# Patient Record
Sex: Female | Born: 1960 | Race: White | Hispanic: No | Marital: Married | Smoking: Never smoker
Health system: Southern US, Community
[De-identification: ages and names within clinical notes are randomized; demographics above are authoritative.]

## PROBLEM LIST (undated history)

## (undated) DIAGNOSIS — Z8742 Personal history of other diseases of the female genital tract: Secondary | ICD-10-CM

## (undated) DIAGNOSIS — N200 Calculus of kidney: Secondary | ICD-10-CM

## (undated) DIAGNOSIS — Z862 Personal history of diseases of the blood and blood-forming organs and certain disorders involving the immune mechanism: Secondary | ICD-10-CM

## (undated) HISTORY — DX: Calculus of kidney: N20.0

## (undated) HISTORY — DX: Personal history of other diseases of the female genital tract: Z87.42

## (undated) HISTORY — DX: Personal history of diseases of the blood and blood-forming organs and certain disorders involving the immune mechanism: Z86.2

## (undated) HISTORY — PX: OTHER SURGICAL HISTORY: SHX169

---

## 2014-09-24 LAB — HM PAP SMEAR: HM PAP: NEGATIVE

## 2016-08-02 ENCOUNTER — Encounter: Payer: Self-pay | Admitting: Certified Nurse Midwife

## 2016-08-02 ENCOUNTER — Ambulatory Visit (INDEPENDENT_AMBULATORY_CARE_PROVIDER_SITE_OTHER): Payer: BLUE CROSS/BLUE SHIELD | Admitting: Certified Nurse Midwife

## 2016-08-02 ENCOUNTER — Other Ambulatory Visit: Payer: Self-pay | Admitting: Certified Nurse Midwife

## 2016-08-02 VITALS — BP 140/96 | HR 66 | Ht 67.0 in | Wt 186.1 lb

## 2016-08-02 DIAGNOSIS — Z1231 Encounter for screening mammogram for malignant neoplasm of breast: Secondary | ICD-10-CM

## 2016-08-02 DIAGNOSIS — E049 Nontoxic goiter, unspecified: Secondary | ICD-10-CM

## 2016-08-02 DIAGNOSIS — Z1239 Encounter for other screening for malignant neoplasm of breast: Secondary | ICD-10-CM

## 2016-08-02 NOTE — Patient Instructions (Signed)
Preventive Care 40-64 Years, Female Preventive care refers to lifestyle choices and visits with your health care provider that can promote health and wellness. What does preventive care include?  A yearly physical exam. This is also called an annual well check.  Dental exams once or twice a year.  Routine eye exams. Ask your health care provider how often you should have your eyes checked.  Personal lifestyle choices, including: ? Daily care of your teeth and gums. ? Regular physical activity. ? Eating a healthy diet. ? Avoiding tobacco and drug use. ? Limiting alcohol use. ? Practicing safe sex. ? Taking low-dose aspirin daily starting at age 56. ? Taking vitamin and mineral supplements as recommended by your health care provider. What happens during an annual well check? The services and screenings done by your health care provider during your annual well check will depend on your age, overall health, lifestyle risk factors, and family history of disease. Counseling Your health care provider may ask you questions about your:  Alcohol use.  Tobacco use.  Drug use.  Emotional well-being.  Home and relationship well-being.  Sexual activity.  Eating habits.  Work and work Statistician.  Method of birth control.  Menstrual cycle.  Pregnancy history.  Screening You may have the following tests or measurements:  Height, weight, and BMI.  Blood pressure.  Lipid and cholesterol levels. These may be checked every 5 years, or more frequently if you are over 56 years old.  Skin check.  Lung cancer screening. You may have this screening every year starting at age 56 if you have a 30-pack-year history of smoking and currently smoke or have quit within the past 15 years.  Fecal occult blood test (FOBT) of the stool. You may have this test every year starting at age 56.  Flexible sigmoidoscopy or colonoscopy. You may have a sigmoidoscopy every 5 years or a colonoscopy  every 10 years starting at age 30.  Hepatitis C blood test.  Hepatitis B blood test.  Sexually transmitted disease (STD) testing.  Diabetes screening. This is done by checking your blood sugar (glucose) after you have not eaten for a while (fasting). You may have this done every 1-3 years.  Mammogram. This may be done every 1-2 years. Talk to your health care provider about when you should start having regular mammograms. This may depend on whether you have a family history of breast cancer.  BRCA-related cancer screening. This may be done if you have a family history of breast, ovarian, tubal, or peritoneal cancers.  Pelvic exam and Pap test. This may be done every 3 years starting at age 56. Starting at age 56, this may be done every 5 years if you have a Pap test in combination with an HPV test.  Bone density scan. This is done to screen for osteoporosis. You may have this scan if you are at high risk for osteoporosis.  Discuss your test results, treatment options, and if necessary, the need for more tests with your health care provider. Vaccines Your health care provider may recommend certain vaccines, such as:  Influenza vaccine. This is recommended every year.  Tetanus, diphtheria, and acellular pertussis (Tdap, Td) vaccine. You may need a Td booster every 10 years.  Varicella vaccine. You may need this if you have not been vaccinated.  Zoster vaccine. You may need this after age 56.  Measles, mumps, and rubella (MMR) vaccine. You may need at least one dose of MMR if you were born in  1956 or later. You may also need a second dose.  Pneumococcal 13-valent conjugate (PCV13) vaccine. You may need this if you have certain conditions and were not previously vaccinated.  Pneumococcal polysaccharide (PPSV23) vaccine. You may need one or two doses if you smoke cigarettes or if you have certain conditions.  Meningococcal vaccine. You may need this if you have certain  conditions.  Hepatitis A vaccine. You may need this if you have certain conditions or if you travel or work in places where you may be exposed to hepatitis A.  Hepatitis B vaccine. You may need this if you have certain conditions or if you travel or work in places where you may be exposed to hepatitis B.  Haemophilus influenzae type b (Hib) vaccine. You may need this if you have certain conditions.  Talk to your health care provider about which screenings and vaccines you need and how often you need them. This information is not intended to replace advice given to you by your health care provider. Make sure you discuss any questions you have with your health care provider. Document Released: 02/05/2015 Document Revised: 09/29/2015 Document Reviewed: 11/10/2014 Elsevier Interactive Patient Education  2017 [employer].

## 2016-08-02 NOTE — Progress Notes (Signed)
ANNUAL PREVENTATIVE CARE GYN  ENCOUNTER NOTE  Subjective:       Amy Ewing is a 56 y.o. No obstetric history on file. female here for a routine annual gynecologic exam.  Current complaints: 1.  None    Gynecologic History Patient's last menstrual period was 04/23/2016 (approximate). Contraception: condoms Last Pap: 2016. Results were: normal Last mammogram: 2016. Results were: normal  Obstetric History OB History  No data available    No past medical history on file.  No past surgical history on file.  No current outpatient prescriptions on file prior to visit.   No current facility-administered medications on file prior to visit.     Allergies no known allergies  Social History   Social History  . Marital status: Married    Spouse name: N/A  . Number of children: N/A  . Years of education: N/A   Occupational History  . Not on file.   Social History Main Topics  . Smoking status: Not on file  . Smokeless tobacco: Not on file  . Alcohol use Not on file  . Drug use: Unknown  . Sexual activity: Not on file   Other Topics Concern  . Not on file   Social History Narrative  . No narrative on file    No family history on file.  The following portions of the patient's history were reviewed and updated as appropriate: allergies, current medications, past family history, past medical history, past social history, past surgical history and problem list.  Review of Systems ROS Review of Systems - General ROS: negative for - chills, fatigue, fever, hot flashes, night sweats, weight gain or weight loss Psychological ROS: negative for - anxiety, decreased libido, depression, mood swings, physical abuse or sexual abuse Ophthalmic ROS: negative for - blurry vision, eye pain or loss of vision ENT ROS: negative for - headaches, hearing change, visual changes or vocal changes Allergy and Immunology ROS: negative for - hives, itchy/watery eyes or seasonal  allergies Hematological and Lymphatic ROS: negative for - bleeding problems, bruising, swollen lymph nodes or weight loss Endocrine ROS: negative for - galactorrhea, hair pattern changes, hot flashes, malaise/lethargy, mood swings, palpitations, polydipsia/polyuria, skin changes, temperature intolerance or unexpected weight changes Breast ROS: negative for - new or changing breast lumps or nipple discharge Respiratory ROS: negative for - cough or shortness of breath Cardiovascular ROS: negative for - chest pain, irregular heartbeat, palpitations or shortness of breath Gastrointestinal ROS: no abdominal pain, change in bowel habits, or black or bloody stools Genito-Urinary ROS: no dysuria, trouble voiding, or hematuria Musculoskeletal ROS: negative for - joint pain or joint stiffness Neurological ROS: negative for - bowel and bladder control changes Dermatological ROS: negative for rash and skin lesion changes   Objective:   BP (!) 140/96   Pulse 66   Ht 5\' 7"  (1.702 m)   Wt 186 lb 1.6 oz (84.4 kg)   LMP 04/23/2016 (Approximate)   BMI 29.15 kg/m  CONSTITUTIONAL: Well-developed, well-nourished female in no acute distress.  PSYCHIATRIC: Normal mood and affect. Normal behavior. Normal judgment and thought content. NEUROLGIC: Alert and oriented to person, place, and time. Normal muscle tone coordination. No cranial nerve deficit noted. HENT:  Normocephalic, atraumatic, External right and left ear normal. Oropharynx is clear and moist EYES: Conjunctivae and EOM are normal. Pupils are equal, round, and reactive to light. No scleral icterus.  NECK: Normal range of motion, supple, no masses.  Thyroid enlarged right side.   SKIN: Skin is warm and  dry. No rash noted. Not diaphoretic. No erythema. No pallor. CARDIOVASCULAR: Normal heart rate noted, regular rhythm, no murmur. RESPIRATORY: Clear to auscultation bilaterally. Effort and breath sounds normal, no problems with respiration noted. BREASTS:  Symmetric in size. No masses, skin changes, nipple drainage, or lymphadenopathy. ABDOMEN: Soft, normal bowel sounds, no distention noted.  No tenderness, rebound or guarding.  BLADDER: Normal PELVIC:  External Genitalia: Normal  BUS: Normal  Vagina: Normal  Cervix: Normal  Uterus: Normal  Adnexa: Normal  RV: External Exam NormaI  MUSCULOSKELETAL: Normal range of motion. No tenderness.  No cyanosis, clubbing, or edema.  2+ distal pulses. LYMPHATIC: No Axillary, Supraclavicular, or Inguinal Adenopathy.    Assessment:   Annual gynecologic examination 56 y.o. Contraception: condoms Overweight Problem List Items Addressed This Visit    None      Plan:  Pap: Pap, Reflex if ASCUS, Pap Co Test, GC/CT NAAT and Not done Mammogram: Ordered Stool Guaiac Testing:  Not Indicated Labs: TSH, will follow up with results Routine preventative health maintenance measures emphasized: Exercise/Diet/Weight control and Stress Management Pt has history of breast cancer in her family, genetic testing options discussed. Return to Clinic - 1 Year   Doreene Burke, [place]

## 2016-08-03 LAB — TSH: TSH: 3.75 u[IU]/mL (ref 0.450–4.500)

## 2016-08-24 ENCOUNTER — Ambulatory Visit
Admission: RE | Admit: 2016-08-24 | Discharge: 2016-08-24 | Disposition: A | Discharge status: Home / Self Care | Payer: BLUE CROSS/BLUE SHIELD | Source: Ambulatory Visit | Attending: Certified Nurse Midwife | Admitting: Certified Nurse Midwife

## 2016-08-24 DIAGNOSIS — Z1231 Encounter for screening mammogram for malignant neoplasm of breast: Secondary | ICD-10-CM | POA: Diagnosis present

## 2016-09-04 ENCOUNTER — Inpatient Hospital Stay
Admission: RE | Admit: 2016-09-04 | Discharge: 2016-09-04 | Disposition: A | Discharge status: Home / Self Care | Payer: Self-pay | Source: Ambulatory Visit | Attending: *Deleted | Admitting: *Deleted

## 2016-09-04 ENCOUNTER — Other Ambulatory Visit: Payer: Self-pay | Admitting: *Deleted

## 2016-09-04 DIAGNOSIS — Z9289 Personal history of other medical treatment: Secondary | ICD-10-CM

## 2017-06-27 ENCOUNTER — Ambulatory Visit: Payer: BLUE CROSS/BLUE SHIELD | Admitting: Internal Medicine

## 2017-08-03 ENCOUNTER — Encounter: Payer: BLUE CROSS/BLUE SHIELD | Admitting: Certified Nurse Midwife

## 2017-08-08 ENCOUNTER — Ambulatory Visit (INDEPENDENT_AMBULATORY_CARE_PROVIDER_SITE_OTHER): Payer: BLUE CROSS/BLUE SHIELD | Admitting: Certified Nurse Midwife

## 2017-08-08 VITALS — BP 131/81 | HR 75 | Ht 67.0 in | Wt 192.6 lb

## 2017-08-08 DIAGNOSIS — Z01419 Encounter for gynecological examination (general) (routine) without abnormal findings: Secondary | ICD-10-CM

## 2017-08-08 NOTE — Patient Instructions (Signed)
Preventive Care 40-64 Years, Female Preventive care refers to lifestyle choices and visits with your health care provider that can promote health and wellness. What does preventive care include?  A yearly physical exam. This is also called an annual well check.  Dental exams once or twice a year.  Routine eye exams. Ask your health care provider how often you should have your eyes checked.  Personal lifestyle choices, including: ? Daily care of your teeth and gums. ? Regular physical activity. ? Eating a healthy diet. ? Avoiding tobacco and drug use. ? Limiting alcohol use. ? Practicing safe sex. ? Taking low-dose aspirin daily starting at age 58. ? Taking vitamin and mineral supplements as recommended by your health care provider. What happens during an annual well check? The services and screenings done by your health care provider during your annual well check will depend on your age, overall health, lifestyle risk factors, and family history of disease. Counseling Your health care provider may ask you questions about your:  Alcohol use.  Tobacco use.  Drug use.  Emotional well-being.  Home and relationship well-being.  Sexual activity.  Eating habits.  Work and work Statistician.  Method of birth control.  Menstrual cycle.  Pregnancy history.  Screening You may have the following tests or measurements:  Height, weight, and BMI.  Blood pressure.  Lipid and cholesterol levels. These may be checked every 5 years, or more frequently if you are over 81 years old.  Skin check.  Lung cancer screening. You may have this screening every year starting at age 78 if you have a 30-pack-year history of smoking and currently smoke or have quit within the past 15 years.  Fecal occult blood test (FOBT) of the stool. You may have this test every year starting at age 65.  Flexible sigmoidoscopy or colonoscopy. You may have a sigmoidoscopy every 5 years or a colonoscopy  every 10 years starting at age 30.  Hepatitis C blood test.  Hepatitis B blood test.  Sexually transmitted disease (STD) testing.  Diabetes screening. This is done by checking your blood sugar (glucose) after you have not eaten for a while (fasting). You may have this done every 1-3 years.  Mammogram. This may be done every 1-2 years. Talk to your health care provider about when you should start having regular mammograms. This may depend on whether you have a family history of breast cancer.  BRCA-related cancer screening. This may be done if you have a family history of breast, ovarian, tubal, or peritoneal cancers.  Pelvic exam and Pap test. This may be done every 3 years starting at age 80. Starting at age 36, this may be done every 5 years if you have a Pap test in combination with an HPV test.  Bone density scan. This is done to screen for osteoporosis. You may have this scan if you are at high risk for osteoporosis.  Discuss your test results, treatment options, and if necessary, the need for more tests with your health care provider. Vaccines Your health care provider may recommend certain vaccines, such as:  Influenza vaccine. This is recommended every year.  Tetanus, diphtheria, and acellular pertussis (Tdap, Td) vaccine. You may need a Td booster every 10 years.  Varicella vaccine. You may need this if you have not been vaccinated.  Zoster vaccine. You may need this after age 5.  Measles, mumps, and rubella (MMR) vaccine. You may need at least one dose of MMR if you were born in  1957 or later. You may also need a second dose.  Pneumococcal 13-valent conjugate (PCV13) vaccine. You may need this if you have certain conditions and were not previously vaccinated.  Pneumococcal polysaccharide (PPSV23) vaccine. You may need one or two doses if you smoke cigarettes or if you have certain conditions.  Meningococcal vaccine. You may need this if you have certain  conditions.  Hepatitis A vaccine. You may need this if you have certain conditions or if you travel or work in places where you may be exposed to hepatitis A.  Hepatitis B vaccine. You may need this if you have certain conditions or if you travel or work in places where you may be exposed to hepatitis B.  Haemophilus influenzae type b (Hib) vaccine. You may need this if you have certain conditions.  Talk to your health care provider about which screenings and vaccines you need and how often you need them. This information is not intended to replace advice given to you by your health care provider. Make sure you discuss any questions you have with your health care provider. Document Released: 02/05/2015 Document Revised: 09/29/2015 Document Reviewed: 11/10/2014 Elsevier Interactive Patient Education  2018 [employer]  

## 2017-08-08 NOTE — Progress Notes (Signed)
GYNECOLOGY ANNUAL PREVENTATIVE CARE ENCOUNTER NOTE  Subjective:   Amy Ewing is a 57 y.o. [id number] female here for a routine annual gynecologic exam.  Current complaints: none  Denies abnormal vaginal bleeding, discharge, pelvic pain, problems with intercourse or other gynecologic concerns.    Gynecologic History Patient's last menstrual period was 01/22/2017. Contraception: none Last Pap: 2016 Results were: normal  Last mammogram: 08/24/2016. Results were: normal  Obstetric History OB History  Gravida Para Term Preterm AB Living  2 2 2     2   SAB TAB Ectopic Multiple Live Births          2    # Outcome Date GA Lbr Len/2nd Weight Sex Delivery Anes PTL Lv  2 Term 07/30/90 [redacted]w[redacted]d   F Vag-Spont   LIV  1 Term 03/06/89 [redacted]w[redacted]d   F Vag-Spont   LIV    Past Medical History:  Diagnosis Date  . H/O: iron deficiency anemia   . History of heavy periods     Past Surgical History:  Procedure Laterality Date  . none      No current outpatient medications on file prior to visit.   No current facility-administered medications on file prior to visit.     No Known Allergies  Social History:  reports that she has never smoked. She has never used smokeless tobacco. She reports that she drinks alcohol. She reports that she does not use drugs.  Family History  Problem Relation Age of Onset  . Cancer Mother 82       breast  . Breast cancer Mother 34  . Cancer Father        prostate  . Hypertension Sister   . Diabetes Maternal Grandmother   . Thyroid disease Maternal Grandmother    Exercise daily: walking 30-45 min Denies Drugs, smoking. Occasional alcohol.  Healthy diet: fruit/vegatables/lean meat. Fast food 1-2 month.  The following portions of the patient's history were reviewed and updated as appropriate: allergies, current medications, past family history, past medical history, past social history, past surgical history and problem list.  Review of Systems Pertinent items noted  in HPI and remainder of comprehensive ROS otherwise negative.   Objective:  BP 131/81   Pulse 75   Ht 5\' 7"  (1.702 m)   Wt 192 lb 9 oz (87.3 kg)   LMP 01/22/2017   BMI 30.16 kg/m  CONSTITUTIONAL: Well-developed, well-nourished female in no acute distress.  HENT:  Normocephalic, atraumatic, External right and left ear normal. Oropharynx is clear and moist EYES: Conjunctivae and EOM are normal. Pupils are equal, round, and reactive to light. No scleral icterus.  NECK: Normal range of motion, supple, no masses.  Normal thyroid.  SKIN: Skin is warm and dry. No rash noted. Not diaphoretic. No erythema. No pallor. MUSCULOSKELETAL: Normal range of motion. No tenderness.  No cyanosis, clubbing, or edema.  2+ distal pulses. NEUROLOGIC: Alert and oriented to person, place, and time. Normal reflexes, muscle tone coordination. No cranial nerve deficit noted. PSYCHIATRIC: Normal mood and affect. Normal behavior. Normal judgment and thought content. CARDIOVASCULAR: Normal heart rate noted, regular rhythm RESPIRATORY: Clear to auscultation bilaterally. Effort and breath sounds normal, no problems with respiration noted. BREASTS: Symmetric in size. No masses, skin changes, nipple drainage, or lymphadenopathy. ABDOMEN: Soft, normal bowel sounds, no distention noted.  No tenderness, rebound or guarding.  PELVIC: Normal appearing external genitalia; normal appearing vaginal mucosa and cervix.  No abnormal discharge noted.  Pap smear obtained.  Normal uterine size, no other  palpable masses, no uterine or adnexal tenderness.    Assessment and Plan:  1. Women's annual routine gynecological examination - Pap IG and HPV (high risk) DNA detection  Will follow up results of pap smear and manage accordingly. Mammogram scheduled Colonoscopy 4 yrs ago/had polyp removed then repeated colonoscopy. Recommended repeat in 2021( per pt). Lipid profile & fasting glucose done by PCP per pt. TSH done last year.   Reviewed Signs and symptoms of Menopause she has mild hot flashes that she is tolerating. Handout "menonotes" given to pt.  Routine preventative health maintenance measures emphasized. Please refer to After Visit Summary for other counseling recommendations.    Doreene BurkeAnnie Urho Rio, CNM

## 2017-08-08 NOTE — Progress Notes (Signed)
Pt is here for a pap smear. LPS 2016 WNL. Has mammogram scheduled.

## 2017-08-11 LAB — PAP IG AND HPV HIGH-RISK
HPV, HIGH-RISK: NEGATIVE
PAP SMEAR COMMENT: 0

## 2017-08-13 ENCOUNTER — Telehealth: Payer: Self-pay

## 2017-08-13 NOTE — Telephone Encounter (Signed)
Pt informed of neg pap and HPV results per AT.

## 2017-09-12 ENCOUNTER — Other Ambulatory Visit: Payer: Self-pay | Admitting: Certified Nurse Midwife

## 2017-09-12 DIAGNOSIS — Z1231 Encounter for screening mammogram for malignant neoplasm of breast: Secondary | ICD-10-CM

## 2017-09-26 ENCOUNTER — Ambulatory Visit
Admission: RE | Admit: 2017-09-26 | Discharge: 2017-09-26 | Disposition: A | Discharge status: Home / Self Care | Payer: BLUE CROSS/BLUE SHIELD | Source: Ambulatory Visit | Attending: Certified Nurse Midwife | Admitting: Certified Nurse Midwife

## 2017-09-26 DIAGNOSIS — Z1231 Encounter for screening mammogram for malignant neoplasm of breast: Secondary | ICD-10-CM | POA: Diagnosis present

## 2018-08-13 ENCOUNTER — Telehealth: Payer: Self-pay

## 2018-08-13 NOTE — Telephone Encounter (Signed)
Coronavirus (COVID-19) Are you at risk?  Are you at risk for the Coronavirus (COVID-19)?  To be considered HIGH RISK for Coronavirus (COVID-19), you have to meet the following criteria:  . Traveled to [place], [place], [place], [place] or [place]; or in the [place] to [place], [place], [place], or [place]; and have fever, cough, and shortness of breath within the last 2 weeks of travel OR . Been in close contact with a person diagnosed with COVID-19 within the last 2 weeks and have fever, cough, and shortness of breath . IF YOU DO NOT MEET THESE CRITERIA, YOU ARE CONSIDERED LOW RISK FOR COVID-19.  What to do if you are HIGH RISK for COVID-19?  . If you are having a medical emergency, call 911. . Seek medical care right away. Before you go to a doctor's office, urgent care or emergency department, call ahead and tell them about your recent travel, contact with someone diagnosed with COVID-19, and your symptoms. You should receive instructions from your physician's office regarding next steps of care.  . When you arrive at healthcare provider, tell the healthcare staff immediately you have returned from visiting [place], [place], [place], [place] or [place]; or traveled in the [place] to [place], [place], [place], or [place]; in the last two weeks or you have been in close contact with a person diagnosed with COVID-19 in the last 2 weeks.   . Tell the health care staff about your symptoms: fever, cough and shortness of breath. . After you have been seen by a medical provider, you will be either: o Tested for (COVID-19) and discharged home on quarantine except to seek medical care if symptoms worsen, and asked to  - Stay home and avoid contact with others until you get your results (4-5 days)  - Avoid travel on public transportation if possible (such as bus, train, or airplane) or o Sent to the Emergency Department by EMS for evaluation, COVID-19 testing, and possible  admission depending on your condition and test results.  What to do if you are LOW RISK for COVID-19?  Reduce your risk of any infection by using the same precautions used for avoiding the common cold or flu:  . Wash your hands often with soap and warm water for at least 20 seconds.  If soap and water are not readily available, use an alcohol-based hand sanitizer with at least 60% alcohol.  . If coughing or sneezing, cover your mouth and nose by coughing or sneezing into the elbow areas of your shirt or coat, into a tissue or into your sleeve (not your hands). . Avoid shaking hands with others and consider head nods or verbal greetings only. . Avoid touching your eyes, nose, or mouth with unwashed hands.  . Avoid close contact with people who are [place]. . Avoid places or events with large numbers of people in one location, like concerts or sporting events. . Carefully consider travel plans you have or are making. . If you are planning any travel outside or inside the [place], visit the CDC's Travelers' Health webpage for the latest health notices. . If you have some symptoms but not all symptoms, continue to monitor at home and seek medical attention if your symptoms worsen. . If you are having a medical emergency, call 911.  08/13/18 SCREENING NEG SLS ADDITIONAL HEALTHCARE OPTIONS FOR PATIENTS  [facility] / e-Visit: [email or web address]         [facility]: [phone]    [facility]: [phone]                   [facility]: [phone]  

## 2018-08-14 ENCOUNTER — Other Ambulatory Visit: Payer: Self-pay

## 2018-08-14 ENCOUNTER — Ambulatory Visit (INDEPENDENT_AMBULATORY_CARE_PROVIDER_SITE_OTHER): Payer: BC Managed Care – PPO | Admitting: Certified Nurse Midwife

## 2018-08-14 ENCOUNTER — Encounter: Payer: Self-pay | Admitting: Certified Nurse Midwife

## 2018-08-14 VITALS — BP 140/84 | HR 69 | Ht 67.0 in | Wt 195.1 lb

## 2018-08-14 DIAGNOSIS — Z01419 Encounter for gynecological examination (general) (routine) without abnormal findings: Secondary | ICD-10-CM | POA: Diagnosis not present

## 2018-08-14 DIAGNOSIS — Z1239 Encounter for other screening for malignant neoplasm of breast: Secondary | ICD-10-CM

## 2018-08-14 NOTE — Progress Notes (Signed)
GYNECOLOGY ANNUAL PREVENTATIVE CARE ENCOUNTER NOTE  History:     Amy Ewing is a 58 y.o. 482P2002 female here for a routine annual gynecologic exam.  Current complaints: none She has occasional hot flashes but is managing them.   Denies abnormal vaginal bleeding, discharge, pelvic pain, problems with intercourse or other gynecologic concerns.    Gynecologic History Patient's last menstrual period was 01/22/2017. Contraception: none Last Pap: 08/08/2017. Results were: normal with negative HPV Last mammogram: 09/26/17. Results were: normal  Obstetric History OB History  Gravida Para Term Preterm AB Living  2 2 2     2   SAB TAB Ectopic Multiple Live Births          2    # Outcome Date GA Lbr Len/2nd Weight Sex Delivery Anes PTL Lv  2 Term 07/30/90 [id number]  7 lb 2 oz (3.232 kg) F Vag-Spont  N LIV  1 Term 03/06/89 [id number]  7 lb 7 oz (3.374 kg) F Vag-Spont  N LIV    Past Medical History:  Diagnosis Date  . H/O: iron deficiency anemia   . History of heavy periods   . Kidney stone on left side     Past Surgical History:  Procedure Laterality Date  . none      Current Outpatient Medications on File Prior to Visit  Medication Sig Dispense Refill  . atorvastatin (LIPITOR) 20 MG tablet      No current facility-administered medications on file prior to visit.     No Known Allergies  Social History:  reports that she has never smoked. She has never used smokeless tobacco. She reports current alcohol use. She reports that she does not use drugs.  Family History  Problem Relation Age of Onset  . Cancer Mother 1749       breast  . Breast cancer Mother [id number]  . Cancer Father        prostate  . Hypertension Sister   . Diabetes Maternal Grandmother   . Thyroid disease Maternal Grandmother     The following portions of the patient's history were reviewed and updated as appropriate: allergies, current medications, past family history, past medical history, past social history, past  surgical history and problem list.  Review of Systems Pertinent items noted in HPI and remainder of comprehensive ROS otherwise negative.  Physical Exam:  BP 140/84   Pulse 69   Ht 5\' 7"  (1.702 m)   Wt 195 lb 2 oz (88.5 kg)   LMP 01/22/2017   BMI 30.56 kg/m  CONSTITUTIONAL: Well-developed, well-nourished over weight female in no acute distress.  HENT:  Normocephalic, atraumatic, External right and left ear normal. Oropharynx is clear and moist EYES: Conjunctivae and EOM are normal. Pupils are equal, round, and reactive to light. No scleral icterus.  NECK: Normal range of motion, supple, no masses.  Normal thyroid.  SKIN: Skin is warm and dry. No rash noted. Not diaphoretic. No erythema. No pallor. MUSCULOSKELETAL: Normal range of motion. No tenderness.  No cyanosis, clubbing, or edema.  2+ distal pulses. NEUROLOGIC: Alert and oriented to person, place, and time. Normal reflexes, muscle tone coordination. No cranial nerve deficit noted. PSYCHIATRIC: Normal mood and affect. Normal behavior. Normal judgment and thought content. CARDIOVASCULAR: Normal heart rate noted, regular rhythm RESPIRATORY: Clear to auscultation bilaterally. Effort and breath sounds normal, no problems with respiration noted. BREASTS: Symmetric in size. No masses, skin changes, nipple drainage, or lymphadenopathy. ABDOMEN: Soft, normal bowel sounds, no distention noted.  No tenderness,  rebound or guarding.  PELVIC: Normal appearing external genitalia; normal appearing vaginal mucosa and cervix.  No abnormal discharge noted.  Pap smear not indicated.  Normal uterine size, no other palpable masses, no uterine or adnexal tenderness.   Assessment and Plan:  Annual Women GYN exam  Pap smear not due until 2024 Mammogram ordered Labs done by PCP Colonoscopy due 2021 Routine preventative health maintenance measures emphasized. Please refer to After Visit Summary for other counseling recommendations.    Philip Aspen,  CNM  [facility]

## 2018-08-14 NOTE — Patient Instructions (Signed)

## 2018-10-07 ENCOUNTER — Ambulatory Visit
Admission: RE | Admit: 2018-10-07 | Discharge: 2018-10-07 | Disposition: A | Discharge status: Home / Self Care | Payer: BC Managed Care – PPO | Source: Ambulatory Visit | Attending: Certified Nurse Midwife | Admitting: Certified Nurse Midwife

## 2018-10-07 DIAGNOSIS — Z01419 Encounter for gynecological examination (general) (routine) without abnormal findings: Secondary | ICD-10-CM | POA: Diagnosis present

## 2018-10-07 DIAGNOSIS — Z1231 Encounter for screening mammogram for malignant neoplasm of breast: Secondary | ICD-10-CM | POA: Diagnosis present

## 2018-10-07 DIAGNOSIS — Z1239 Encounter for other screening for malignant neoplasm of breast: Secondary | ICD-10-CM

## 2019-08-15 ENCOUNTER — Encounter: Payer: BC Managed Care – PPO | Admitting: Certified Nurse Midwife

## 2019-08-27 ENCOUNTER — Encounter: Payer: Self-pay | Admitting: Certified Nurse Midwife

## 2019-08-27 ENCOUNTER — Ambulatory Visit (INDEPENDENT_AMBULATORY_CARE_PROVIDER_SITE_OTHER): Payer: BC Managed Care – PPO | Admitting: Certified Nurse Midwife

## 2019-08-27 VITALS — BP 144/90 | HR 75 | Ht 67.0 in | Wt 196.2 lb

## 2019-08-27 DIAGNOSIS — Z23 Encounter for immunization: Secondary | ICD-10-CM | POA: Diagnosis not present

## 2019-08-27 DIAGNOSIS — Z1211 Encounter for screening for malignant neoplasm of colon: Secondary | ICD-10-CM

## 2019-08-27 DIAGNOSIS — Z01419 Encounter for gynecological examination (general) (routine) without abnormal findings: Secondary | ICD-10-CM

## 2019-08-27 DIAGNOSIS — Z1231 Encounter for screening mammogram for malignant neoplasm of breast: Secondary | ICD-10-CM

## 2019-08-27 MED ORDER — TETANUS-DIPHTH-ACELL PERTUSSIS 5-2.5-18.5 LF-MCG/0.5 IM SUSP
0.5000 mL | Freq: Once | INTRAMUSCULAR | Status: AC
  Administered 2019-08-27: 0.5 mL via INTRAMUSCULAR

## 2019-08-27 NOTE — Progress Notes (Signed)
GYNECOLOGY ANNUAL PREVENTATIVE CARE ENCOUNTER NOTE  History:     Amy Ewing is a 59 y.o. G61P2002 female here for a routine annual gynecologic exam.  Current complaints: none   Denies abnormal vaginal bleeding, discharge, pelvic pain, problems with intercourse or other gynecologic concerns. Occasional mild hot flashes that are not bothersome to her.     Social  Relationship:Married Living: with husband Work: Facilities manager Exercise: walking daily 30-45 min Eats healthy diet Smoke/alcoho/drugs:Occasional alcohol.   Gynecologic History Patient's last menstrual period was 01/22/2017. Contraception: post menopausal status Last Pap: 08/08/2017. Results were: normal with negative HPV Last mammogram: 10/07/18. Results were: normal  Colonoscopy 5 yrs ago/had polyp removed then repeated colonoscopy. Recommended repeat in 2021( per pt Obstetric History OB History  Gravida Para Term Preterm AB Living  2 2 2     2   SAB TAB Ectopic Multiple Live Births          2    # Outcome Date GA Lbr Len/2nd Weight Sex Delivery Anes PTL Lv  2 Term 07/30/90 [redacted]w[redacted]d  7 lb 2 oz (3.232 kg) F Vag-Spont  N LIV  1 Term 03/06/89 [redacted]w[redacted]d  7 lb 7 oz (3.374 kg) F Vag-Spont  N LIV    Past Medical History:  Diagnosis Date  . H/O: iron deficiency anemia   . History of heavy periods   . Kidney stone on left side     Past Surgical History:  Procedure Laterality Date  . none      Current Outpatient Medications on File Prior to Visit  Medication Sig Dispense Refill  . atorvastatin (LIPITOR) 20 MG tablet      No current facility-administered medications on file prior to visit.    No Known Allergies  Social History:  reports that she has never smoked. She has never used smokeless tobacco. She reports current alcohol use. She reports that she does not use drugs.  Family History  Problem Relation Age of Onset  . Cancer Mother 43       breast  . Breast cancer Mother 75  .  Cancer Father        prostate  . Hypertension Sister   . Diabetes Maternal Grandmother   . Thyroid disease Maternal Grandmother     The following portions of the patient's history were reviewed and updated as appropriate: allergies, current medications, past family history, past medical history, past social history, past surgical history and problem list.  Review of Systems Pertinent items noted in HPI and remainder of comprehensive ROS otherwise negative.  Physical Exam:  BP (!) 144/90   Pulse 75   Ht 5\' 7"  (1.702 m)   Wt 196 lb 3 oz (89 kg)   LMP 01/22/2017   BMI 30.73 kg/m   BP elevated pt state she is stressed just came from work and had some issues. Repeat: 136/82  CONSTITUTIONAL: Well-developed, well-nourished female in no acute distress.  HENT:  Normocephalic, atraumatic, External right and left ear normal. Oropharynx is clear and moist EYES: Conjunctivae and EOM are normal. Pupils are equal, round, and reactive to light. No scleral icterus.  NECK: Normal range of motion, supple, no masses.  Normal thyroid.  SKIN: Skin is warm and dry. No rash noted. Not diaphoretic. No erythema. No pallor. MUSCULOSKELETAL: Normal range of motion. No tenderness.  No cyanosis, clubbing, or edema.  2+ distal pulses. NEUROLOGIC: Alert and oriented to person, place, and time. Normal reflexes, muscle tone coordination.  PSYCHIATRIC: Normal mood and affect. Normal behavior. Normal judgment and thought content. CARDIOVASCULAR: Normal heart rate noted, regular rhythm RESPIRATORY: Clear to auscultation bilaterally. Effort and breath sounds normal, no problems with respiration noted. BREASTS: Symmetric in size. No masses, tenderness, skin changes, nipple drainage, or lymphadenopathy bilaterally. Performed in the presence of a chaperone. ABDOMEN: Soft, no distention noted.  No tenderness, rebound or guarding.  PELVIC: Normal appearing external genitalia and urethral meatus; normal appearing vaginal  mucosa and cervix.  No abnormal discharge noted.  Pap smear obtained.  Normal uterine size, no other palpable masses, no uterine or adnexal tenderness.  Performed in the presence of a chaperone.   Assessment and Plan:    1. Women's annual routine gynecological examination  Pap smear: not due Mammogram ordered Labs: Has  Labs do through her work Refills/orders Tdap  Referral: GI for colonoscopy  Routine preventative health maintenance measures emphasized. Please refer to After Visit Summary for other counseling recommendations.      Doreene Burke, CNM [facility], [facility]

## 2019-08-27 NOTE — Patient Instructions (Signed)

## 2019-10-08 ENCOUNTER — Other Ambulatory Visit: Payer: Self-pay

## 2019-10-08 ENCOUNTER — Ambulatory Visit
Admission: RE | Admit: 2019-10-08 | Discharge: 2019-10-08 | Disposition: A | Discharge status: Home / Self Care | Payer: BC Managed Care – PPO | Source: Ambulatory Visit | Attending: Certified Nurse Midwife | Admitting: Certified Nurse Midwife

## 2019-10-08 DIAGNOSIS — Z01419 Encounter for gynecological examination (general) (routine) without abnormal findings: Secondary | ICD-10-CM

## 2019-10-08 DIAGNOSIS — Z1231 Encounter for screening mammogram for malignant neoplasm of breast: Secondary | ICD-10-CM | POA: Diagnosis not present

## 2020-08-31 ENCOUNTER — Encounter: Payer: BC Managed Care – PPO | Admitting: Certified Nurse Midwife

## 2020-08-31 ENCOUNTER — Ambulatory Visit (INDEPENDENT_AMBULATORY_CARE_PROVIDER_SITE_OTHER): Payer: BC Managed Care – PPO | Admitting: Certified Nurse Midwife

## 2020-08-31 ENCOUNTER — Other Ambulatory Visit: Payer: Self-pay

## 2020-08-31 ENCOUNTER — Encounter: Payer: Self-pay | Admitting: Certified Nurse Midwife

## 2020-08-31 VITALS — BP 128/96 | HR 80 | Ht 67.0 in | Wt 193.2 lb

## 2020-08-31 DIAGNOSIS — Z01419 Encounter for gynecological examination (general) (routine) without abnormal findings: Secondary | ICD-10-CM

## 2020-08-31 DIAGNOSIS — Z1231 Encounter for screening mammogram for malignant neoplasm of breast: Secondary | ICD-10-CM

## 2020-08-31 DIAGNOSIS — E663 Overweight: Secondary | ICD-10-CM | POA: Diagnosis not present

## 2020-08-31 NOTE — Progress Notes (Signed)
GYNECOLOGY ANNUAL PREVENTATIVE CARE ENCOUNTER NOTE  History:     Amy Ewing is a 60 y.o. G82P2002 female here for a routine annual gynecologic exam.  Current complaints: none.   Denies abnormal vaginal bleeding, discharge, pelvic pain, problems with intercourse or other gynecologic concerns.     Social Relationship: married  Living: with spouse Work: FT Barrister's clerk Exercise: walking daily 30-45 min Smoke/Alcohol/drug use: occ alcohol use   Gynecologic History Patient's last menstrual period was 01/22/2017. Contraception: post menopausal status Last Pap: 08/08/2017. Results were: normal with negative HPV Last mammogram: 10/09/2019. Results were: normal Colonoscopy: done 6 yrs ago, has follow up sept 2022  Obstetric History OB History  Gravida Para Term Preterm AB Living  2 2 2     2   SAB IAB Ectopic Multiple Live Births          2    # Outcome Date GA Lbr Len/2nd Weight Sex Delivery Anes PTL Lv  2 Term 07/30/90 [redacted]w[redacted]d  7 lb 2 oz (3.232 kg) F Vag-Spont  N LIV  1 Term 03/06/89 [redacted]w[redacted]d  7 lb 7 oz (3.374 kg) F Vag-Spont  N LIV    Past Medical History:  Diagnosis Date   H/O: iron deficiency anemia    History of heavy periods    Kidney stone on left side     Past Surgical History:  Procedure Laterality Date   none      Current Outpatient Medications on File Prior to Visit  Medication Sig Dispense Refill   atorvastatin (LIPITOR) 20 MG tablet      No current facility-administered medications on file prior to visit.    No Known Allergies  Social History:  reports that she has never smoked. She has never used smokeless tobacco. She reports current alcohol use. She reports that she does not use drugs.  Family History  Problem Relation Age of Onset   Cancer Mother 38       breast   Breast cancer Mother 53   Cancer Father        prostate   Hypertension Sister    Diabetes Maternal Grandmother    Thyroid disease Maternal Grandmother      The following portions of the patient's history were reviewed and updated as appropriate: allergies, current medications, past family history, past medical history, past social history, past surgical history and problem list.  Review of Systems Pertinent items noted in HPI and remainder of comprehensive ROS otherwise negative.  Physical Exam:  BP (!) 128/96   Pulse 80   Ht 5\' 7"  (1.702 m)   Wt 193 lb 3.2 oz (87.6 kg)   LMP 01/22/2017   BMI 30.26 kg/m  CONSTITUTIONAL: Well-developed, well-nourished , over weight female in no acute distress.  HENT:  Normocephalic, atraumatic, External right and left ear normal. Oropharynx is clear and moist EYES: Conjunctivae and EOM are normal. Pupils are equal, round, and reactive to light. No scleral icterus.  NECK: Normal range of motion, supple, no masses.  Normal thyroid.  SKIN: Skin is warm and dry. No rash noted. Not diaphoretic. No erythema. No pallor. MUSCULOSKELETAL: Normal range of motion. No tenderness.  No cyanosis, clubbing, or edema.  2+ distal pulses. NEUROLOGIC: Alert and oriented to person, place, and time. Normal reflexes, muscle tone coordination.  PSYCHIATRIC: Normal mood and affect. Normal behavior. Normal judgment and thought content. CARDIOVASCULAR: Normal heart rate noted, regular rhythm RESPIRATORY: Clear to auscultation bilaterally. Effort and breath sounds normal, no  problems with respiration noted. BREASTS: Symmetric in size. No masses, tenderness, skin changes, nipple drainage, or lymphadenopathy bilaterally.  ABDOMEN: Soft, no distention noted.  No tenderness, rebound or guarding.  PELVIC: Normal appearing external genitalia and urethral meatus; normal appearing vaginal mucosa and cervix.  No abnormal discharge noted.  Pap smear not due.  Normal uterine size, no other palpable masses, no uterine or adnexal tenderness.  .   Assessment and Plan:    1. Well woman exam with routine gynecological exam  Pap: not  due Mammogram :ordered Labs: has done a PCP Refills: none Referral: none BP elevated, pt states she was rushing to get here. That had PCP visit in June and BP normal. Encouraged pt to have rept BP done when not under stress and if elevated to follow up with PCP. She verbalizes and agrees to plan.  Routine preventative health maintenance measures emphasized. Please refer to After Visit Summary for other counseling recommendations.      Doreene Burke, CNM [facility],  [facility]

## 2020-10-08 ENCOUNTER — Ambulatory Visit
Admission: RE | Admit: 2020-10-08 | Discharge: 2020-10-08 | Disposition: A | Discharge status: Home / Self Care | Payer: BC Managed Care – PPO | Source: Ambulatory Visit | Attending: Certified Nurse Midwife | Admitting: Certified Nurse Midwife

## 2020-10-08 ENCOUNTER — Other Ambulatory Visit: Payer: Self-pay

## 2020-10-08 DIAGNOSIS — Z1231 Encounter for screening mammogram for malignant neoplasm of breast: Secondary | ICD-10-CM | POA: Diagnosis not present

## 2020-10-08 DIAGNOSIS — Z01419 Encounter for gynecological examination (general) (routine) without abnormal findings: Secondary | ICD-10-CM | POA: Insufficient documentation

## 2021-09-02 ENCOUNTER — Other Ambulatory Visit: Payer: Self-pay | Admitting: Certified Nurse Midwife

## 2021-09-02 DIAGNOSIS — Z1231 Encounter for screening mammogram for malignant neoplasm of breast: Secondary | ICD-10-CM

## 2021-09-06 ENCOUNTER — Ambulatory Visit (INDEPENDENT_AMBULATORY_CARE_PROVIDER_SITE_OTHER): Payer: BC Managed Care – PPO | Admitting: Certified Nurse Midwife

## 2021-09-06 VITALS — BP 147/89 | HR 69 | Ht 67.0 in | Wt 193.0 lb

## 2021-09-06 DIAGNOSIS — Z01419 Encounter for gynecological examination (general) (routine) without abnormal findings: Secondary | ICD-10-CM | POA: Diagnosis not present

## 2021-09-06 NOTE — Progress Notes (Addendum)
GYNECOLOGY ANNUAL PREVENTATIVE CARE ENCOUNTER NOTE  History:     Amy Ewing is a 61 y.o. G63P2002 female here for a routine annual gynecologic exam.  Current complaints: none.   Denies abnormal vaginal bleeding, discharge, pelvic pain, problems with intercourse or other gynecologic concerns.     Social Relationship:Married Living: spouse  Work: [employer]  Exercise: 30-45 min daily  Smoke/Alcohol/drug use: occ alcohol use , no drugs or smoking  Gynecologic History Patient's last menstrual period was 01/22/2017. Contraception: post menopausal status Last Pap: 08/08/2017. Results were: normal with negative HPV Last mammogram: 10/08/2020. Results were: normal Colonoscopy: 12/2020, due 2032 Obstetric History OB History  Gravida Para Term Preterm AB Living  2 2 2     2   SAB IAB Ectopic Multiple Live Births          2    # Outcome Date GA Lbr Len/2nd Weight Sex Delivery Anes PTL Lv  2 Term 07/30/90 [redacted]w[redacted]d  7 lb 2 oz (3.232 kg) F Vag-Spont  N LIV  1 Term 03/06/89 [redacted]w[redacted]d  7 lb 7 oz (3.374 kg) F Vag-Spont  N LIV    Past Medical History:  Diagnosis Date   H/O: iron deficiency anemia    History of heavy periods    Kidney stone on left side     Past Surgical History:  Procedure Laterality Date   none      Current Outpatient Medications on File Prior to Visit  Medication Sig Dispense Refill   atorvastatin (LIPITOR) 20 MG tablet      No current facility-administered medications on file prior to visit.    No Known Allergies  Social History:  reports that she has never smoked. She has never used smokeless tobacco. She reports current alcohol use. She reports that she does not use drugs.  Family History  Problem Relation Age of Onset   Cancer Mother 69       breast   Breast cancer Mother 9   Cancer Father        prostate   Hypertension Sister    Diabetes Maternal Grandmother    Thyroid disease Maternal Grandmother     The following  portions of the patient's history were reviewed and updated as appropriate: allergies, current medications, past family history, past medical history, past social history, past surgical history and problem list.  Review of Systems Pertinent items noted in HPI and remainder of comprehensive ROS otherwise negative.  Physical Exam:  BP (!) 147/89   Pulse 69   Ht 5\' 7"  (1.702 m)   Wt 193 lb (87.5 kg)   LMP 01/22/2017   BMI 30.23 kg/m  CONSTITUTIONAL: Well-developed, well-nourished female in no acute distress.  HENT:  Normocephalic, atraumatic, External right and left ear normal. Oropharynx is clear and moist EYES: Conjunctivae and EOM are normal. Pupils are equal, round, and reactive to light. No scleral icterus.  NECK: Normal range of motion, supple, no masses.  Normal thyroid.  SKIN: Skin is warm and dry. No rash noted. Not diaphoretic. No erythema. No pallor. MUSCULOSKELETAL: Normal range of motion. No tenderness.  No cyanosis, clubbing, or edema.  2+ distal pulses. NEUROLOGIC: Alert and oriented to person, place, and time. Normal reflexes, muscle tone coordination.  PSYCHIATRIC: Normal mood and affect. Normal behavior. Normal judgment and thought content. CARDIOVASCULAR: Normal heart rate noted, regular rhythm RESPIRATORY: Clear to auscultation bilaterally. Effort and breath sounds normal, no problems with respiration noted. BREASTS: Symmetric in size. No masses,  tenderness, skin changes, nipple drainage, or lymphadenopathy bilaterally.  ABDOMEN: Soft, no distention noted.  No tenderness, rebound or guarding.  PELVIC: Normal appearing external genitalia and urethral meatus; normal appearing vaginal mucosa and cervix. Normal atrophic changes.  No abnormal discharge noted.  Pap smear not due.  Normal uterine size, no other palpable masses, no uterine or adnexal tenderness.  .   Assessment and Plan:    1. Well woman exam with routine gynecological exam   Pap: not due  Mammogram :  scheduled for 9/23 Labs: declines has done with PCP Refills: none Referral: none  Routine preventative health maintenance measures emphasized. Please refer to After Visit Summary for other counseling recommendations.      Doreene Burke, CNM [facility],  [facility]

## 2021-10-13 ENCOUNTER — Ambulatory Visit
Admission: RE | Admit: 2021-10-13 | Discharge: 2021-10-13 | Disposition: A | Discharge status: Home / Self Care | Payer: BC Managed Care – PPO | Source: Ambulatory Visit | Attending: Certified Nurse Midwife | Admitting: Certified Nurse Midwife

## 2021-10-13 DIAGNOSIS — Z1231 Encounter for screening mammogram for malignant neoplasm of breast: Secondary | ICD-10-CM | POA: Insufficient documentation

## 2022-08-30 ENCOUNTER — Other Ambulatory Visit: Payer: Self-pay | Admitting: Family Medicine

## 2022-08-30 DIAGNOSIS — Z1231 Encounter for screening mammogram for malignant neoplasm of breast: Secondary | ICD-10-CM

## 2022-10-12 ENCOUNTER — Ambulatory Visit (INDEPENDENT_AMBULATORY_CARE_PROVIDER_SITE_OTHER): Payer: BC Managed Care – PPO | Admitting: Certified Nurse Midwife

## 2022-10-12 ENCOUNTER — Encounter: Payer: Self-pay | Admitting: Certified Nurse Midwife

## 2022-10-12 ENCOUNTER — Other Ambulatory Visit (HOSPITAL_COMMUNITY)
Admission: RE | Admit: 2022-10-12 | Discharge: 2022-10-12 | Disposition: A | Discharge status: Home / Self Care | Payer: BC Managed Care – PPO | Source: Ambulatory Visit | Attending: Certified Nurse Midwife | Admitting: Certified Nurse Midwife

## 2022-10-12 VITALS — BP 126/84 | HR 82 | Ht 67.0 in | Wt 190.8 lb

## 2022-10-12 DIAGNOSIS — Z01419 Encounter for gynecological examination (general) (routine) without abnormal findings: Secondary | ICD-10-CM | POA: Insufficient documentation

## 2022-10-12 DIAGNOSIS — Z1231 Encounter for screening mammogram for malignant neoplasm of breast: Secondary | ICD-10-CM

## 2022-10-12 DIAGNOSIS — Z124 Encounter for screening for malignant neoplasm of cervix: Secondary | ICD-10-CM

## 2022-10-12 MED ORDER — PREMPRO 0.3-1.5 MG PO TABS: 1.0000 | ORAL_TABLET | Freq: Every day | ORAL | 11 refills | Status: AC

## 2022-10-12 NOTE — Progress Notes (Signed)
GYNECOLOGY ANNUAL PREVENTATIVE CARE ENCOUNTER NOTE  History:     Amy Ewing is a 62 y.o. G84P2002 female here for a routine annual gynecologic exam.  Current complaints: pt is interested in HRT for treatment of hot flashes, weight gain.   Denies abnormal vaginal bleeding, discharge, pelvic pain, problems with intercourse or other gynecologic concerns.     Social Relationship: Married  Living: with spouse  Work: Higher education careers adviser  Exercise: 30- 45 min daily  Smoke/Alcohol/drug use: occasional alcohol use, denies smoking/vaping and drug use.   Gynecologic History Patient's last menstrual period was 01/22/2017. Contraception: post menopausal status Last Pap: 08/08/2017. Results were: normal with negative HPV Last mammogram: 10/13/2021. Results were: normal (sched 10/19/22) Colonoscopy: 12/2020 , neg per pt ( due 2032)   Obstetric History OB History  Gravida Para Term Preterm AB Living  2 2 2     2   SAB IAB Ectopic Multiple Live Births          2    # Outcome Date GA Lbr Len/2nd Weight Sex Type Anes PTL Lv  2 Term 07/30/90 [redacted]w[redacted]d  7 lb 2 oz (3.232 kg) F Vag-Spont  N LIV  1 Term 03/06/89 [redacted]w[redacted]d  7 lb 7 oz (3.374 kg) F Vag-Spont  N LIV    Past Medical History:  Diagnosis Date   H/O: iron deficiency anemia    History of heavy periods    Kidney stone on left side     Past Surgical History:  Procedure Laterality Date   none      Current Outpatient Medications on File Prior to Visit  Medication Sig Dispense Refill   pravastatin (PRAVACHOL) 20 MG tablet Take 20 mg by mouth daily.     No current facility-administered medications on file prior to visit.    No Known Allergies  Social History:  reports that she has never smoked. She has never used smokeless tobacco. She reports current alcohol use. She reports that she does not use drugs.  Family History  Problem Relation Age of Onset   Cancer Mother 29       breast   Breast cancer Mother 30   Cancer  Father        prostate   Hypertension Sister    Diabetes Maternal Grandmother    Thyroid disease Maternal Grandmother     The following portions of the patient's history were reviewed and updated as appropriate: allergies, current medications, past family history, past medical history, past social history, past surgical history and problem list.  Review of Systems Pertinent items noted in HPI and remainder of comprehensive ROS otherwise negative.  Physical Exam:  BP 126/84   Pulse 82   Ht 5\' 7"  (1.702 m)   Wt 190 lb 12.8 oz (86.5 kg)   LMP 01/22/2017   BMI 29.88 kg/m  CONSTITUTIONAL: Well-developed, well-nourished female in no acute distress.  HENT:  Normocephalic, atraumatic, External right and left ear normal. Oropharynx is clear and moist EYES: Conjunctivae and EOM are normal. Pupils are equal, round, and reactive to light. No scleral icterus.  NECK: Normal range of motion, supple, no masses.  Normal thyroid.  SKIN: Skin is warm and dry. No rash noted. Not diaphoretic. No erythema. No pallor. MUSCULOSKELETAL: Normal range of motion. No tenderness.  No cyanosis, clubbing, or edema.  2+ distal pulses. NEUROLOGIC: Alert and oriented to person, place, and time. Normal reflexes, muscle tone coordination.  PSYCHIATRIC: Normal mood and affect. Normal  behavior. Normal judgment and thought content. CARDIOVASCULAR: Normal heart rate noted, regular rhythm RESPIRATORY: Clear to auscultation bilaterally. Effort and breath sounds normal, no problems with respiration noted. BREASTS: Symmetric in size. No masses, tenderness, skin changes, nipple drainage, or lymphadenopathy bilaterally.  ABDOMEN: Soft, no distention noted.  No tenderness, rebound or guarding.  PELVIC: Normal appearing external genitalia and urethral meatus; normal appearing vaginal mucosa and cervix.  No abnormal discharge noted.  Pap smear obtained.  Normal uterine size, no other palpable masses, no uterine or adnexal tenderness.   .   Assessment and Plan:    1. Women's annual routine gynecological examination    Pap: Will follow up results of pap smear and manage accordingly. Mammogram : scheduled for next week  Labs: declines has done with PCP Refills/orders: HRT prempro 0.3 mgE 1.5 mg P daily reviewed risks and benefits HRT . She verbalizes understanding and agrees.  Referral: none  Routine preventative health maintenance measures emphasized. Please refer to After Visit Summary for other counseling recommendations.      Doreene Burke, CNM [facility]  [facility],  [facility]

## 2022-10-12 NOTE — Patient Instructions (Signed)
Preventive Care 40-62 Years Old, Female  Preventive care refers to lifestyle choices and visits with your health care provider that can promote health and wellness. Preventive care visits are also called wellness exams.  What can I expect for my preventive care visit?  Counseling  Your health care provider may ask you questions about your:  Medical history, including:  Past medical problems.  Family medical history.  Pregnancy history.  Current health, including:  Menstrual cycle.  Method of birth control.  Emotional well-being.  Home life and relationship well-being.  Sexual activity and sexual health.  Lifestyle, including:  Alcohol, nicotine or tobacco, and drug use.  Access to firearms.  Diet, exercise, and sleep habits.  Work and work Astronomer.  Sunscreen use.  Safety issues such as seatbelt and bike helmet use.  Physical exam  Your health care provider will check your:  Height and weight. These may be used to calculate your BMI (body mass index). BMI is a measurement that tells if you are at a healthy weight.  Waist circumference. This measures the distance around your waistline. This measurement also tells if you are at a healthy weight and may help predict your risk of certain diseases, such as type 2 diabetes and high blood pressure.  Heart rate and blood pressure.  Body temperature.  Skin for abnormal spots.  What immunizations do I need?    Vaccines are usually given at various ages, according to a schedule. Your health care provider will recommend vaccines for you based on your age, medical history, and lifestyle or other factors, such as travel or where you work.  What tests do I need?  Screening  Your health care provider may recommend screening tests for certain conditions. This may include:  Lipid and cholesterol levels.  Diabetes screening. This is done by checking your blood sugar (glucose) after you have not eaten for a while (fasting).  Pelvic exam and Pap test.  Hepatitis B test.  Hepatitis C  test.  HIV (human immunodeficiency virus) test.  STI (sexually transmitted infection) testing, if you are at risk.  Lung cancer screening.  Colorectal cancer screening.  Mammogram. Talk with your health care provider about when you should start having regular mammograms. This may depend on whether you have a family history of breast cancer.  BRCA-related cancer screening. This may be done if you have a family history of breast, ovarian, tubal, or peritoneal cancers.  Bone density scan. This is done to screen for osteoporosis.  Talk with your health care provider about your test results, treatment options, and if necessary, the need for more tests.  Follow these instructions at home:  Eating and drinking    Eat a diet that includes fresh fruits and vegetables, whole grains, lean protein, and low-fat dairy products.  Take vitamin and mineral supplements as recommended by your health care provider.  Do not drink alcohol if:  Your health care provider tells you not to drink.  You are pregnant, may be pregnant, or are planning to become pregnant.  If you drink alcohol:  Limit how much you have to 0-1 drink a day.  Know how much alcohol is in your drink. In the [place] one drink equals one 12 oz bottle of beer (355 mL), one 5 oz glass of wine (148 mL), or one 1 oz glass of hard liquor (44 mL).  Lifestyle  Brush your teeth every morning and night with fluoride toothpaste. Floss one time each day.  Exercise for at least  30 minutes 5 or more days each week.  Do not use any products that contain nicotine or tobacco. These products include cigarettes, chewing tobacco, and vaping devices, such as e-cigarettes. If you need help quitting, ask your health care provider.  Do not use drugs.  If you are sexually active, practice safe sex. Use a condom or other form of protection to prevent STIs.  If you do not wish to become pregnant, use a form of birth control. If you plan to become pregnant, see your health care provider for a  prepregnancy visit.  Take aspirin only as told by your health care provider. Make sure that you understand how much to take and what form to take. Work with your health care provider to find out whether it is safe and beneficial for you to take aspirin daily.  Find healthy ways to manage stress, such as:  Meditation, yoga, or listening to music.  Journaling.  Talking to a trusted person.  Spending time with friends and family.  Minimize exposure to UV radiation to reduce your risk of skin cancer.  Safety  Always wear your seat belt while driving or riding in a vehicle.  Do not drive:  If you have been drinking alcohol. Do not ride with someone who has been drinking.  When you are tired or distracted.  While texting.  If you have been using any mind-altering substances or drugs.  Wear a helmet and other protective equipment during sports activities.  If you have firearms in your house, make sure you follow all gun safety procedures.  Seek help if you have been physically or sexually abused.  What's next?  Visit your health care provider once a year for an annual wellness visit.  Ask your health care provider how often you should have your eyes and teeth checked.  Stay up to date on all vaccines.  This information is not intended to replace advice given to you by your health care provider. Make sure you discuss any questions you have with your health care provider.  Document Revised: 07/07/2020 Document Reviewed: 07/07/2020  Elsevier Patient Education  2024 [employer].

## 2022-10-17 LAB — CYTOLOGY - PAP
Comment: NEGATIVE
Diagnosis: NEGATIVE
High risk HPV: NEGATIVE

## 2022-10-19 ENCOUNTER — Ambulatory Visit
Admission: RE | Admit: 2022-10-19 | Discharge: 2022-10-19 | Disposition: A | Discharge status: Home / Self Care | Payer: BC Managed Care – PPO | Source: Ambulatory Visit | Attending: Family Medicine | Admitting: Family Medicine

## 2022-10-19 DIAGNOSIS — Z1231 Encounter for screening mammogram for malignant neoplasm of breast: Secondary | ICD-10-CM | POA: Diagnosis present

## 2023-02-28 ENCOUNTER — Telehealth: Payer: Self-pay

## 2023-02-28 NOTE — Telephone Encounter (Signed)
 Spoke with patient. She notices pinkish with wiping. She has not had to wear a pad. She's having a little pelvic discomfort (like period cramps). Advised Annie is not in the office. She will be on call tomorrow. Message sent for her review.

## 2023-02-28 NOTE — Telephone Encounter (Signed)
 TRIAGE VOICEMAIL: Patient states she saw Zelda Hummer 09/2022 and started on Prempro . She has been doing good on it until this point. For the past week, she has had some spotting, which she believes is abnormal since she is post menopausal. She is considering stopping the prempro . Requesting return call to discuss.

## 2023-03-01 NOTE — Telephone Encounter (Signed)
 Patient aware. She will monitor and scheduled appointment if it continues.

## 2023-04-23 IMAGING — MG MM DIGITAL SCREENING BILAT W/ TOMO AND CAD
6 of 10 series · 6 of 30 positions shown · non-contrast
Comparison: Previous exam(s).

CLINICAL DATA: Screening.

EXAM:
DIGITAL SCREENING BILATERAL MAMMOGRAM WITH TOMOSYNTHESIS AND CAD
TECHNIQUE: Bilateral screening digital craniocaudal and mediolateral oblique
mammograms were obtained. Bilateral screening digital breast
tomosynthesis was performed. The images were evaluated with
computer-aided detection.

[L CC synth-2D]
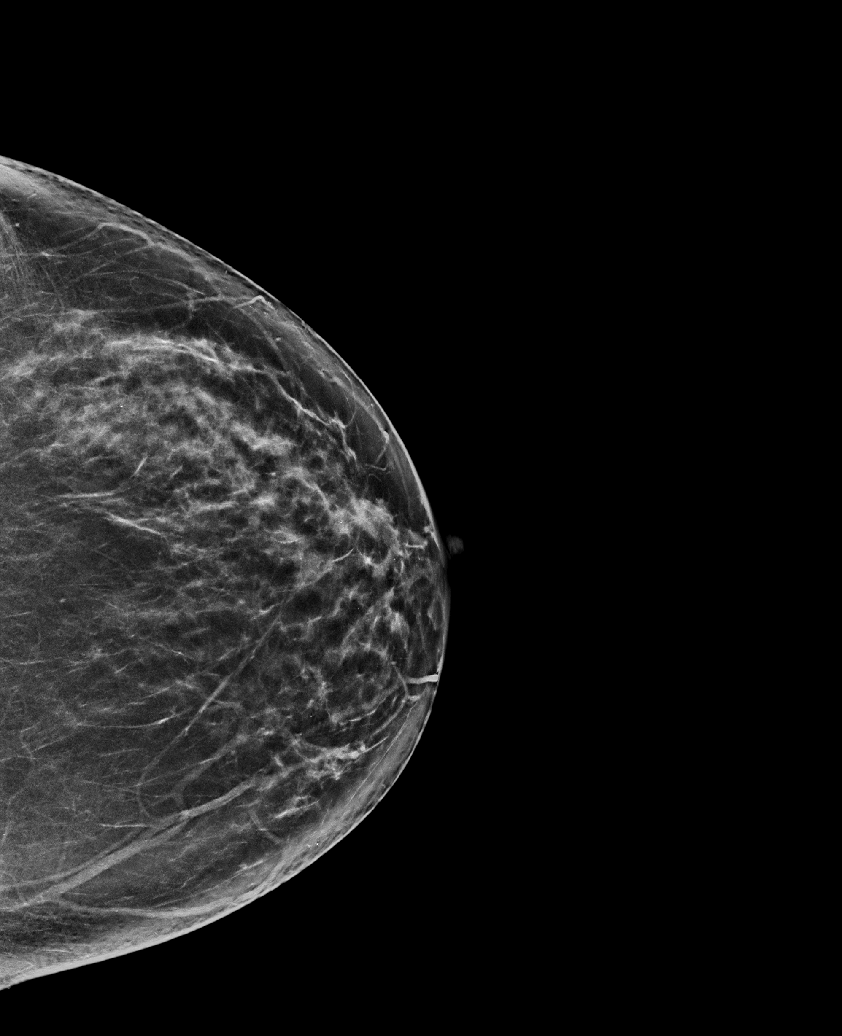

[R MLO synth-2D]
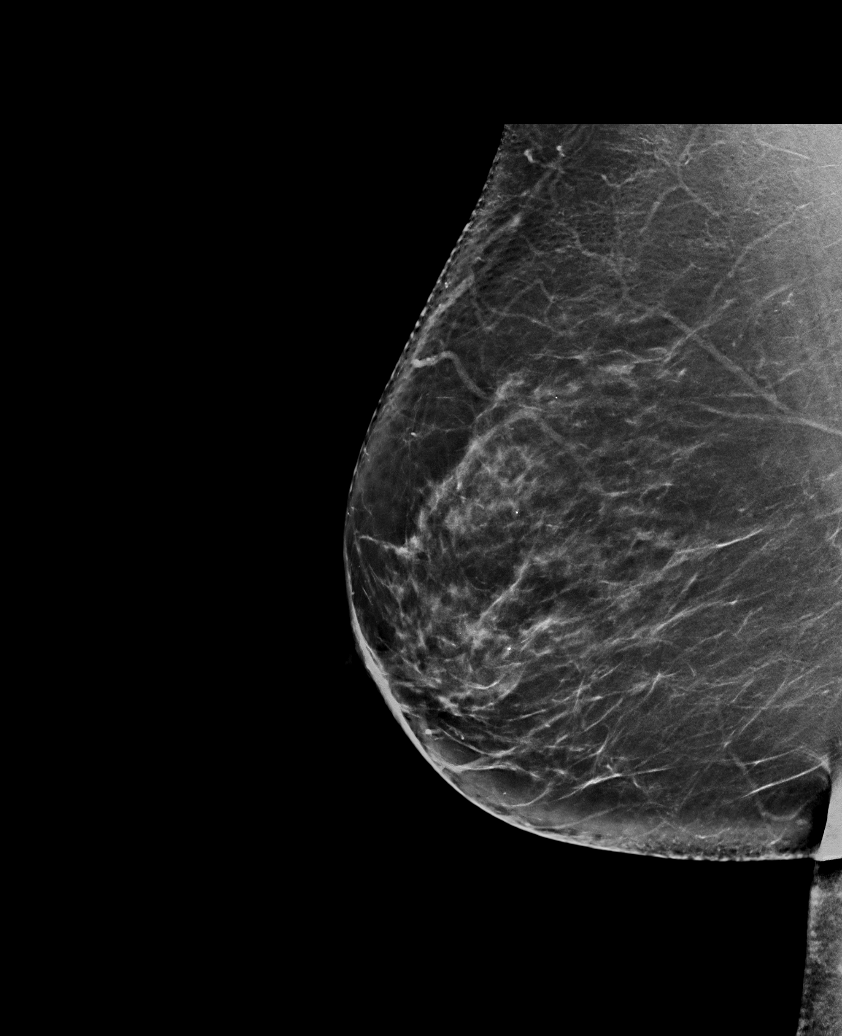

[R CC synth-2D (1 of 2)]
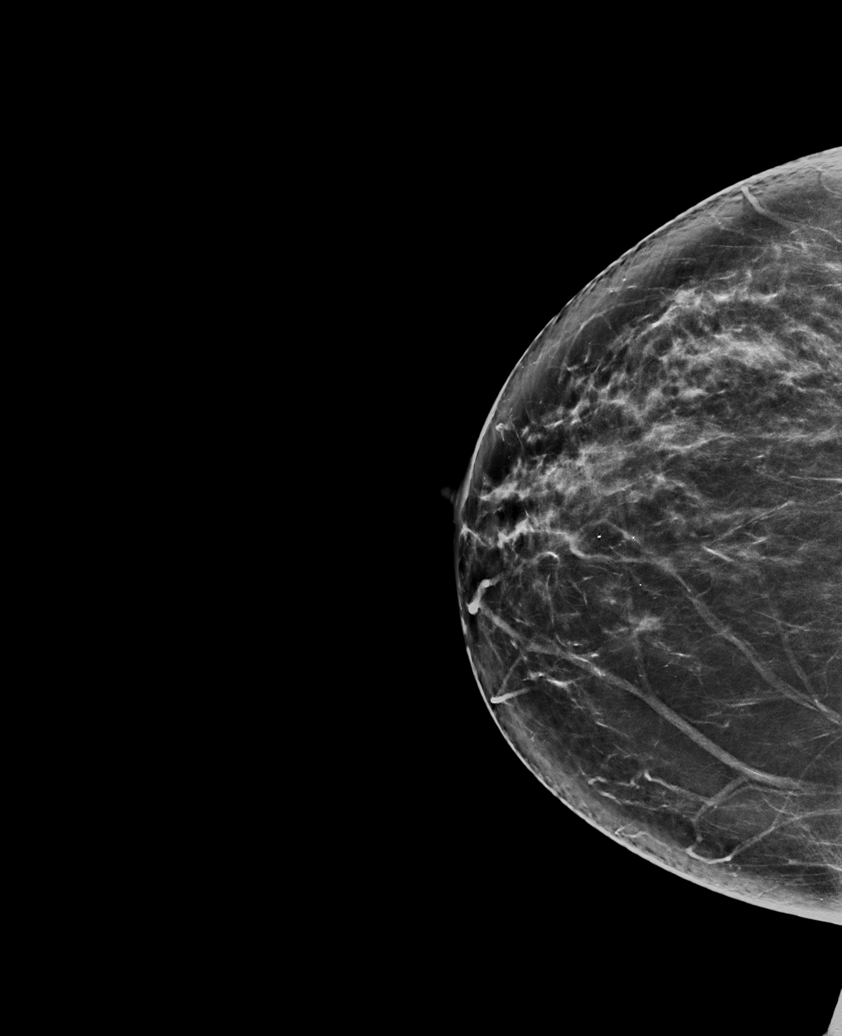

[L MLO synth-2D]
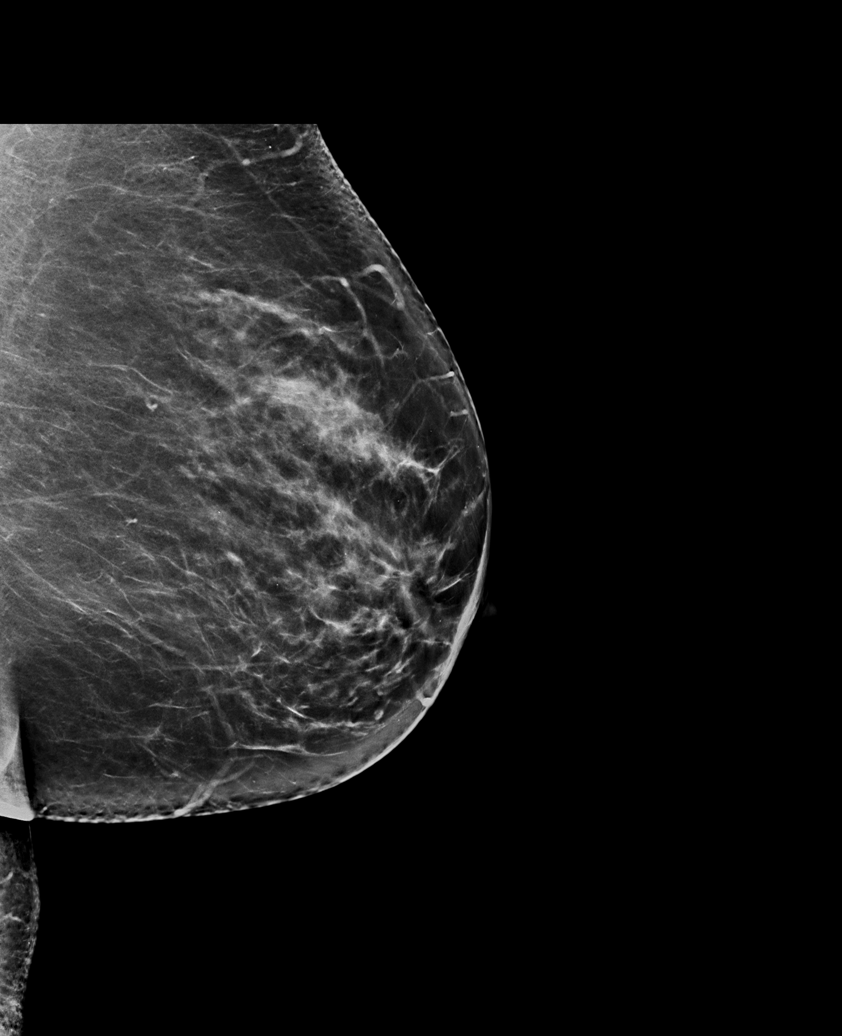

[R CC synth-2D (2 of 2)]
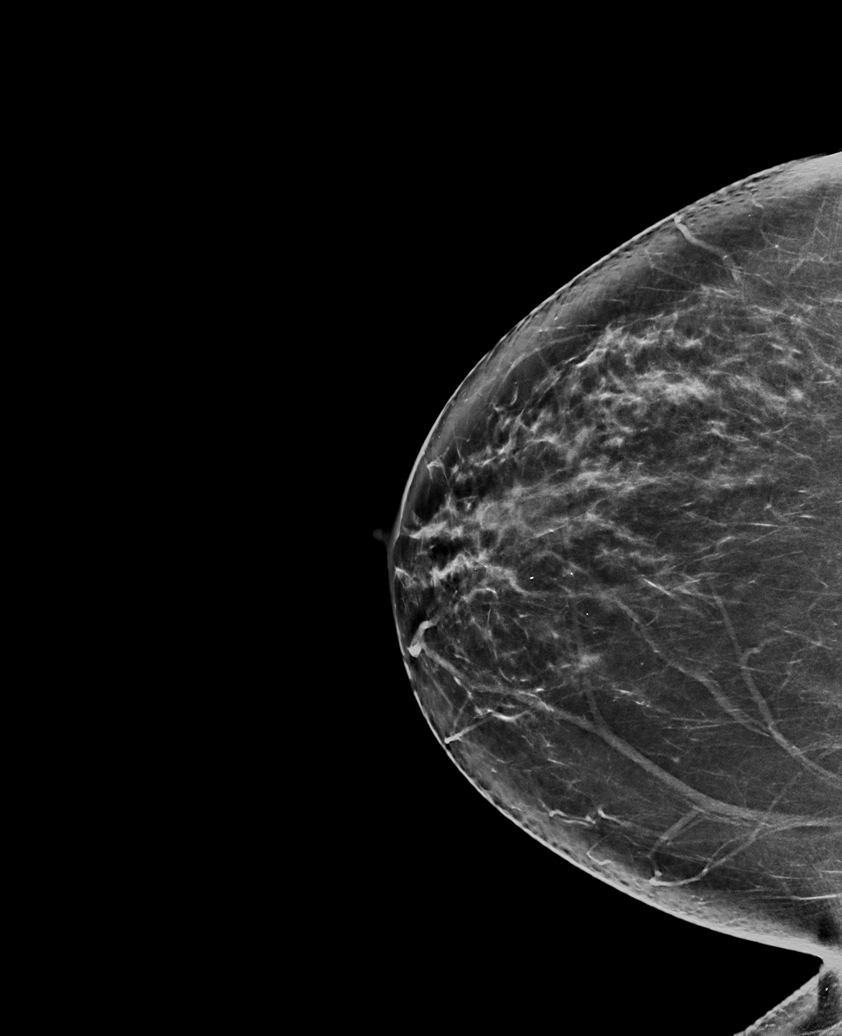

[L MLO tomo · tomo slice 43/86.0]
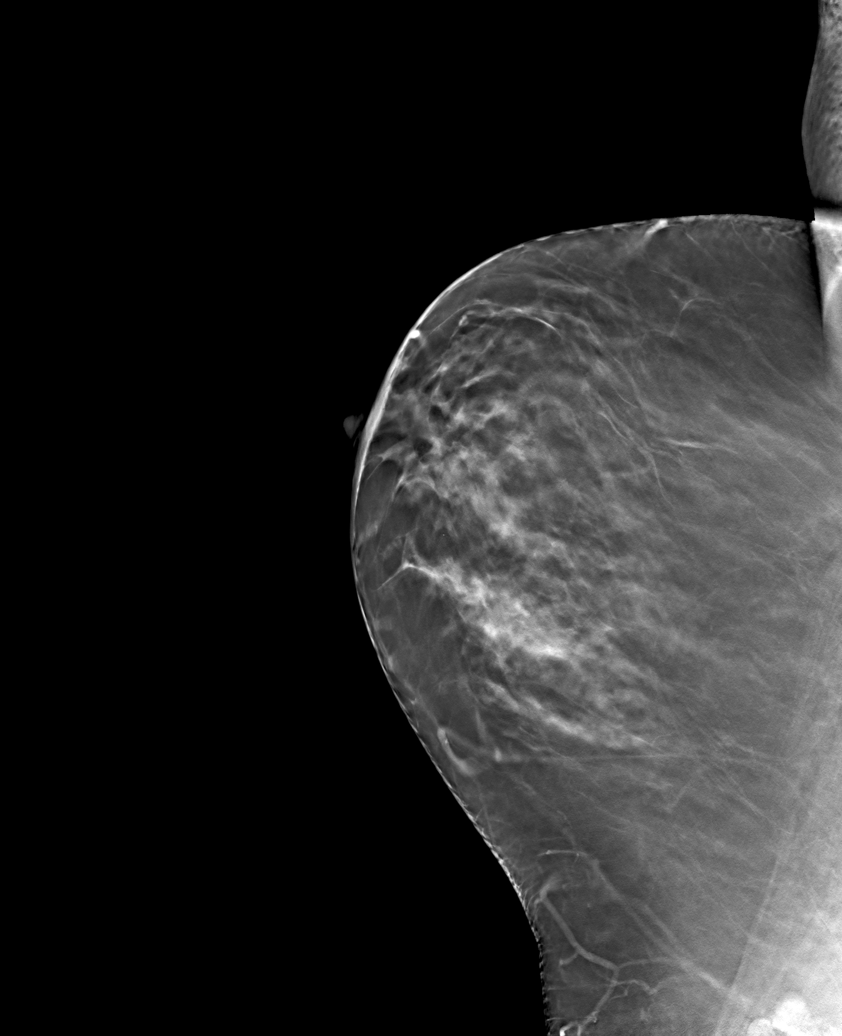

[6 of 30 positions shown; findings below may reference images not displayed]

ACR Breast Density Category c: The breast tissue is heterogeneously
dense, which may obscure small masses.
FINDINGS: There are no findings suspicious for malignancy.
IMPRESSION: No mammographic evidence of malignancy. A result letter of this
screening mammogram will be mailed directly to the patient.

RECOMMENDATION:
Screening mammogram in one year. (Code:[id number])

BI-RADS CATEGORY  1: Negative.

## 2023-08-25 ENCOUNTER — Other Ambulatory Visit: Payer: Self-pay | Admitting: Certified Nurse Midwife
